# Patient Record
Sex: Male | Born: 1945 | Race: White | Hispanic: No | Marital: Married | Smoking: Never smoker
Health system: Southern US, Community
[De-identification: ages and names within clinical notes are randomized; demographics above are authoritative.]

## PROBLEM LIST (undated history)

## (undated) DIAGNOSIS — E7800 Pure hypercholesterolemia, unspecified: Secondary | ICD-10-CM

## (undated) DIAGNOSIS — E119 Type 2 diabetes mellitus without complications: Secondary | ICD-10-CM

---

## 2012-03-27 ENCOUNTER — Ambulatory Visit: Payer: Self-pay | Admitting: Family Medicine

## 2012-04-12 ENCOUNTER — Ambulatory Visit: Payer: Self-pay | Admitting: Emergency Medicine

## 2012-04-12 LAB — RAPID STREP-A WITH REFLX: Micro Text Report: NEGATIVE

## 2012-04-14 LAB — BETA STREP CULTURE(ARMC)

## 2014-04-08 ENCOUNTER — Ambulatory Visit: Payer: Self-pay | Admitting: Physician Assistant

## 2019-10-24 ENCOUNTER — Other Ambulatory Visit: Payer: Self-pay

## 2019-10-24 DIAGNOSIS — Z20822 Contact with and (suspected) exposure to covid-19: Secondary | ICD-10-CM

## 2019-10-26 LAB — NOVEL CORONAVIRUS, NAA: SARS-CoV-2, NAA: NOT DETECTED

## 2021-04-04 ENCOUNTER — Ambulatory Visit
Admission: EM | Admit: 2021-04-04 | Discharge: 2021-04-04 | Disposition: A | Discharge status: Home / Self Care | Payer: No Typology Code available for payment source | Attending: Family Medicine | Admitting: Family Medicine

## 2021-04-04 ENCOUNTER — Encounter: Payer: Self-pay | Admitting: Emergency Medicine

## 2021-04-04 ENCOUNTER — Ambulatory Visit (INDEPENDENT_AMBULATORY_CARE_PROVIDER_SITE_OTHER): Payer: No Typology Code available for payment source

## 2021-04-04 DIAGNOSIS — R0981 Nasal congestion: Secondary | ICD-10-CM | POA: Diagnosis not present

## 2021-04-04 DIAGNOSIS — R059 Cough, unspecified: Secondary | ICD-10-CM | POA: Diagnosis not present

## 2021-04-04 DIAGNOSIS — Z20822 Contact with and (suspected) exposure to covid-19: Secondary | ICD-10-CM | POA: Insufficient documentation

## 2021-04-04 DIAGNOSIS — R531 Weakness: Secondary | ICD-10-CM | POA: Insufficient documentation

## 2021-04-04 DIAGNOSIS — R52 Pain, unspecified: Secondary | ICD-10-CM | POA: Diagnosis not present

## 2021-04-04 DIAGNOSIS — J111 Influenza due to unidentified influenza virus with other respiratory manifestations: Secondary | ICD-10-CM

## 2021-04-04 DIAGNOSIS — R509 Fever, unspecified: Secondary | ICD-10-CM | POA: Diagnosis not present

## 2021-04-04 HISTORY — DX: Type 2 diabetes mellitus without complications: E11.9

## 2021-04-04 HISTORY — DX: Pure hypercholesterolemia, unspecified: E78.00

## 2021-04-04 LAB — RESP PANEL BY RT-PCR (FLU A&B, COVID) ARPGX2
Influenza A by PCR: NEGATIVE
Influenza B by PCR: NEGATIVE
SARS Coronavirus 2 by RT PCR: NEGATIVE

## 2021-04-04 MED ORDER — PROMETHAZINE-DM 6.25-15 MG/5ML PO SYRP: 5.0000 mL | ORAL_SOLUTION | Freq: Four times a day (QID) | ORAL | 0 refills | Status: AC | PRN

## 2021-04-04 NOTE — ED Triage Notes (Signed)
Patient c/o fever, weakness, nasal congestion that started last Thursday. He states he feels like he has the flu.

## 2021-04-04 NOTE — Discharge Instructions (Signed)
Rest. Fluids.  Medication as prescribed.  Tylenol as needed for fever.  Take care  Dr. Adriana Simas

## 2021-04-06 NOTE — ED Provider Notes (Signed)
[facility]    CSN: [id number] Arrival date & time: 04/04/21  1817      History   Chief Complaint Chief Complaint  Patient presents with  . Fever  . Weakness  . Nasal Congestion   HPI  75 year old male presents with the above complaints.  Symptoms started on Thursday.  He reports fever, generalized weakness, congestion.  Overall feels poorly.  Pain 5/10 in severity.  No relieving factors.  He has had recent sick contacts.  Desires testing today.  No other associated symptoms.  No other complaints.  Past Medical History:  Diagnosis Date  . Diabetes mellitus without complication (HCC)   . High cholesterol    Home Medications    Prior to Admission medications   Medication Sig Start Date End Date Taking? Authorizing Provider  aspirin 81 MG EC tablet Take 1 tablet by mouth daily. 09/19/13  Yes [provider]  metFORMIN (GLUCOPHAGE) 500 MG tablet Take by mouth. 05/28/20  Yes [provider]  promethazine-dextromethorphan (PROMETHAZINE-DM) 6.25-15 MG/5ML syrup Take 5 mLs by mouth 4 (four) times daily as needed for cough. 04/04/21  Yes Deo Mehringer G, DO  simvastatin (ZOCOR) 40 MG tablet Take by mouth.   Yes [provider]    Family History No family history on file.  Social History Social History   Tobacco Use  . Smoking status: Never Smoker  . Smokeless tobacco: Never Used  Substance Use Topics  . Alcohol use: Never  . Drug use: Never     Allergies   Patient has no known allergies.   Review of Systems Review of Systems Per HPI  Physical Exam Triage Vital Signs ED Triage Vitals  Enc Vitals Group     BP 04/04/21 1843 (!) 148/91     Pulse Rate 04/04/21 1843 (!) 102     Resp 04/04/21 1843 18     Temp 04/04/21 1843 (!) 101.6 F (38.7 C)     Temp Source 04/04/21 1843 Oral     SpO2 04/04/21 1843 99 %     Weight 04/04/21 1840 200 lb (90.7 kg)     Height 04/04/21 1840 5\' 10"  (1.778 m)     Head Circumference --      Peak  Flow --      Pain Score 04/04/21 1840 5     Pain Loc --      Pain Edu? --      Excl. in GC? --    Updated Vital Signs BP (!) 148/91 (BP Location: Left Arm)   Pulse (!) 102   Temp (!) 101.6 F (38.7 C) (Oral)   Resp 18   Ht 5\' 10"  (1.778 m)   Wt 90.7 kg   SpO2 99%   BMI 28.70 kg/m   Visual Acuity Right Eye Distance:   Left Eye Distance:   Bilateral Distance:    Right Eye Near:   Left Eye Near:    Bilateral Near:     Physical Exam Vitals and nursing note reviewed.  Constitutional:      General: He is not in acute distress.    Appearance: Normal appearance. He is not ill-appearing.  HENT:     Head: Normocephalic and atraumatic.  Eyes:     General:        Right eye: No discharge.        Left eye: No discharge.     Conjunctiva/sclera: Conjunctivae normal.  Cardiovascular:     Rate and Rhythm: Regular rhythm. Tachycardia present.  Pulmonary:     Effort: Pulmonary effort is normal.     Breath sounds: Normal breath sounds. No wheezing, rhonchi or rales.  Neurological:     Mental Status: He is alert.  Psychiatric:        Mood and Affect: Mood normal.        Behavior: Behavior normal.    UC Treatments / Results  Labs (all labs ordered are listed, but only abnormal results are displayed) Labs Reviewed  RESP PANEL BY RT-PCR (FLU A&B, COVID) [id number]    EKG   Radiology DG Chest 2 View  Result Date: 04/04/2021 CLINICAL DATA:  Cough and fever for several days EXAM: CHEST - 2 VIEW COMPARISON:  None. FINDINGS: Cardiac shadow is within normal limits. The lungs are clear bilaterally. No effusion is seen. No bony abnormality is noted. IMPRESSION: No active cardiopulmonary disease. Electronically Signed   By: Alcide Clever M.D.   On: 04/04/2021 20:15    Procedures Procedures (including critical care time)  Medications Ordered in UC Medications - No data to display  Initial Impression / Assessment and Plan / UC Course  I have reviewed the triage vital signs and the  nursing notes.  Pertinent labs & imaging results that were available during my care of the patient were reviewed by me and considered in my medical decision making (see chart for details).    75 year old male presents with influenza-like illness.  Patient currently febrile and tachycardic.  Flu and COVID testing negative.  Chest x-ray was obtained and was independently reviewed by me.  Normal chest x-ray.  I suspect that this is a viral illness of some sort.  Promethazine DM for cough.  Advised Tylenol for fever.  Supportive care.  Final Clinical Impressions(s) / UC Diagnoses   Final diagnoses:  Influenza-like illness     Discharge Instructions     Rest. Fluids.  Medication as prescribed.  Tylenol as needed for fever.  Take care  Dr. Adriana Simas    ED Prescriptions    Medication Sig Dispense Auth. Provider   promethazine-dextromethorphan (PROMETHAZINE-DM) 6.25-15 MG/5ML syrup Take 5 mLs by mouth 4 (four) times daily as needed for cough. 118 mL Tommie Sams, DO     PDMP not reviewed this encounter.   Tommie Sams, [place] 04/06/21 [phone]

## 2022-04-25 IMAGING — CR DG CHEST 2V
4 series · 4 of 4 positions shown · non-contrast
Comparison: None.

CLINICAL DATA: Cough and fever for several days

EXAM:
CHEST - 2 VIEW

[chest pa (1 of 2)]
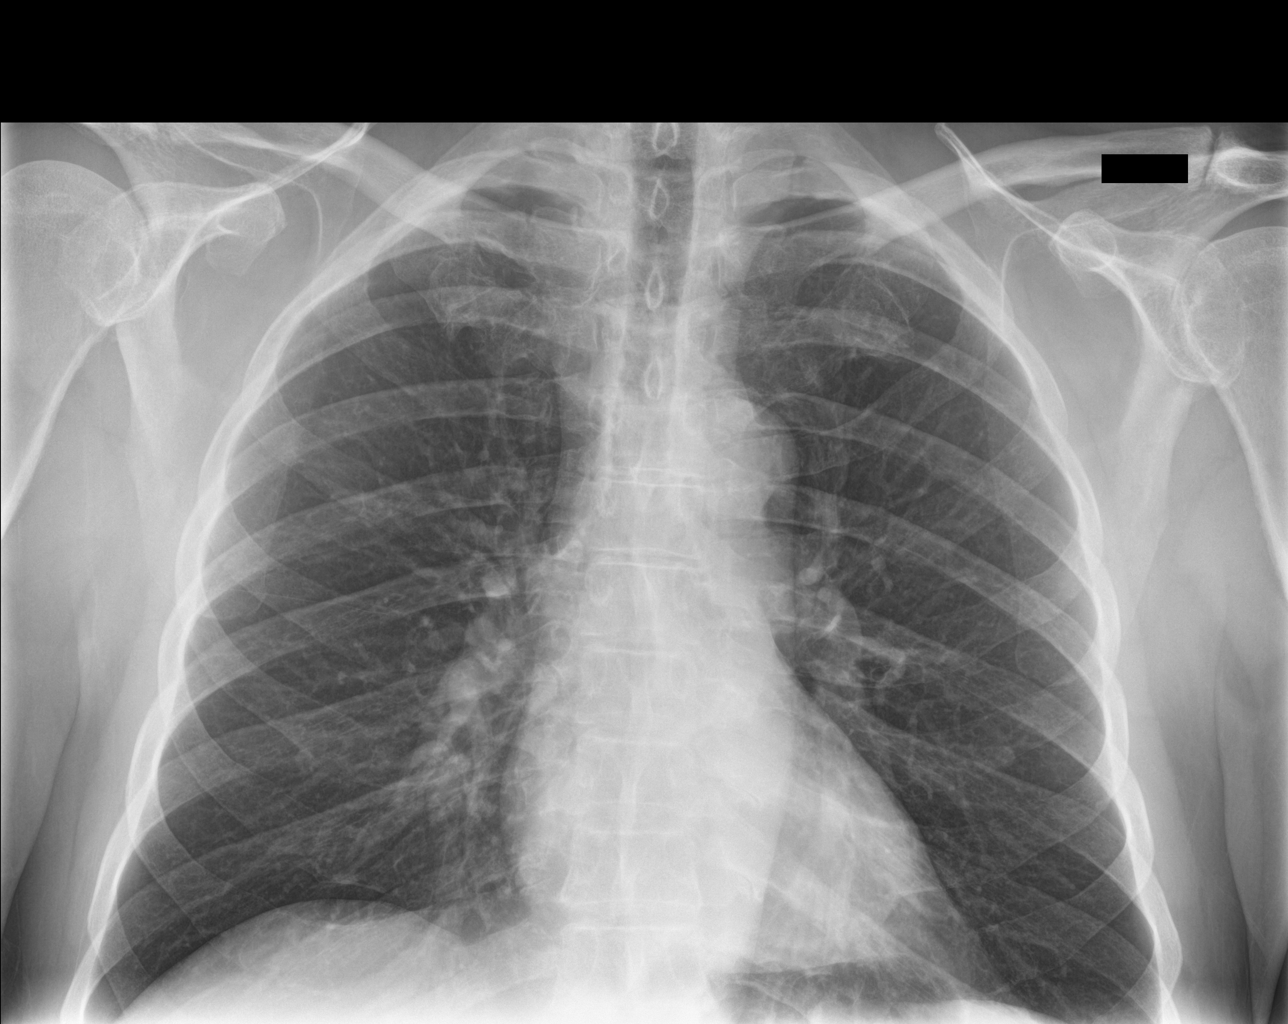

[chest lat (1 of 2)]
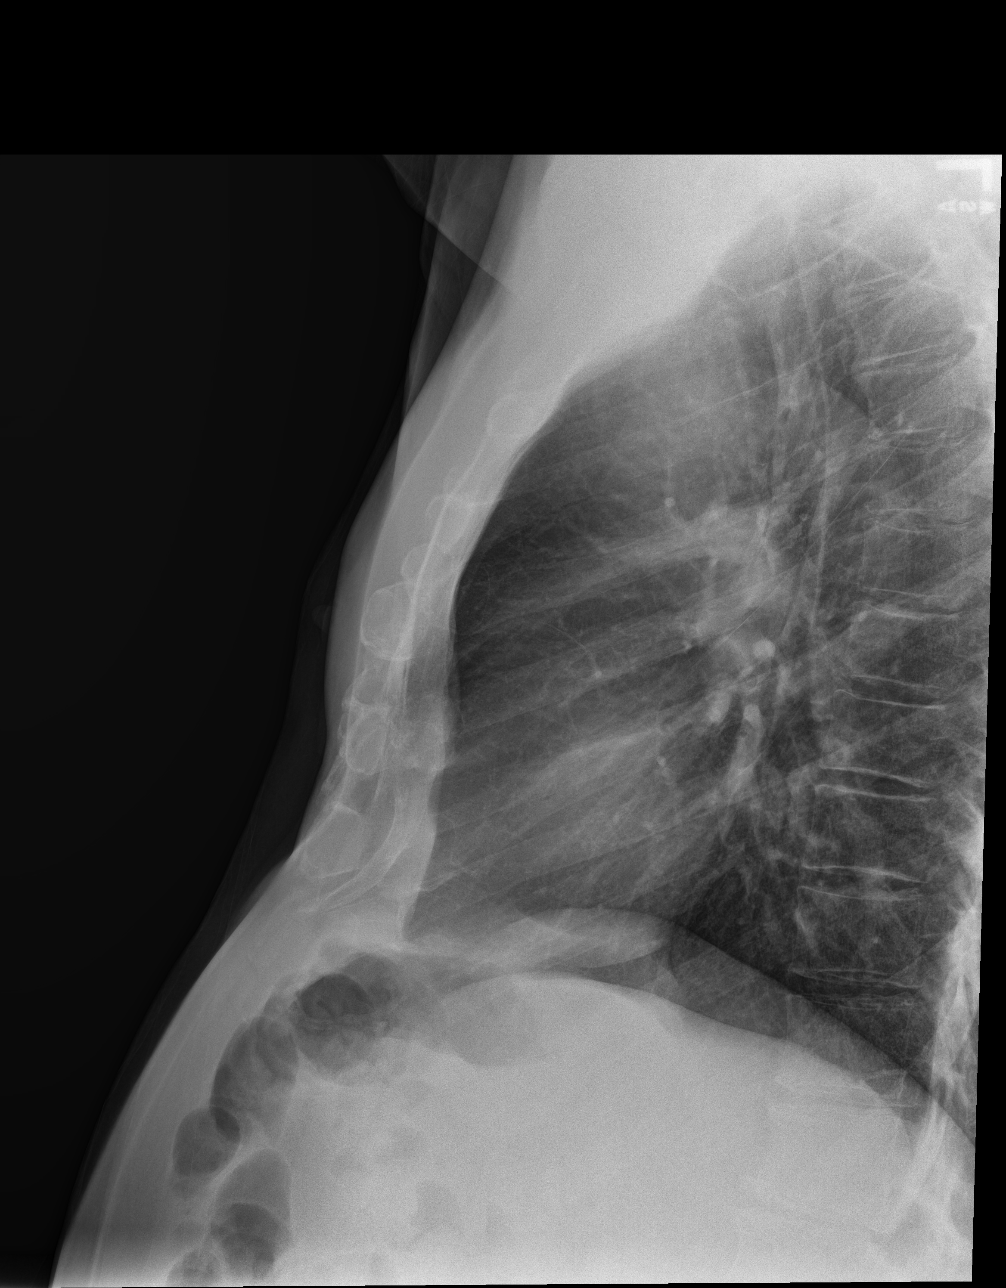

[chest pa (2 of 2)]
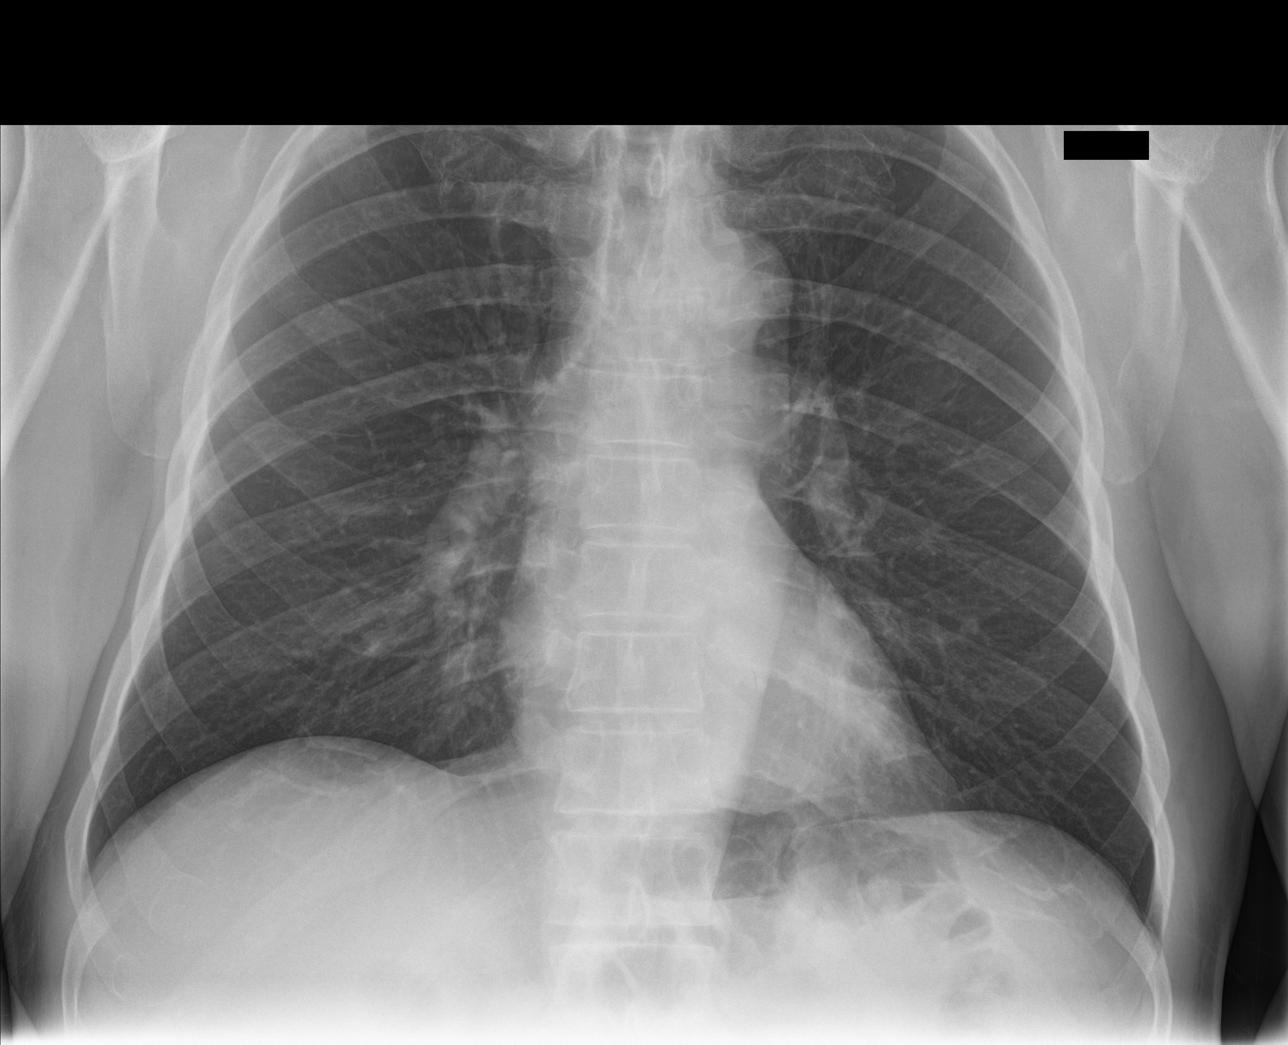

[chest lat (2 of 2)]
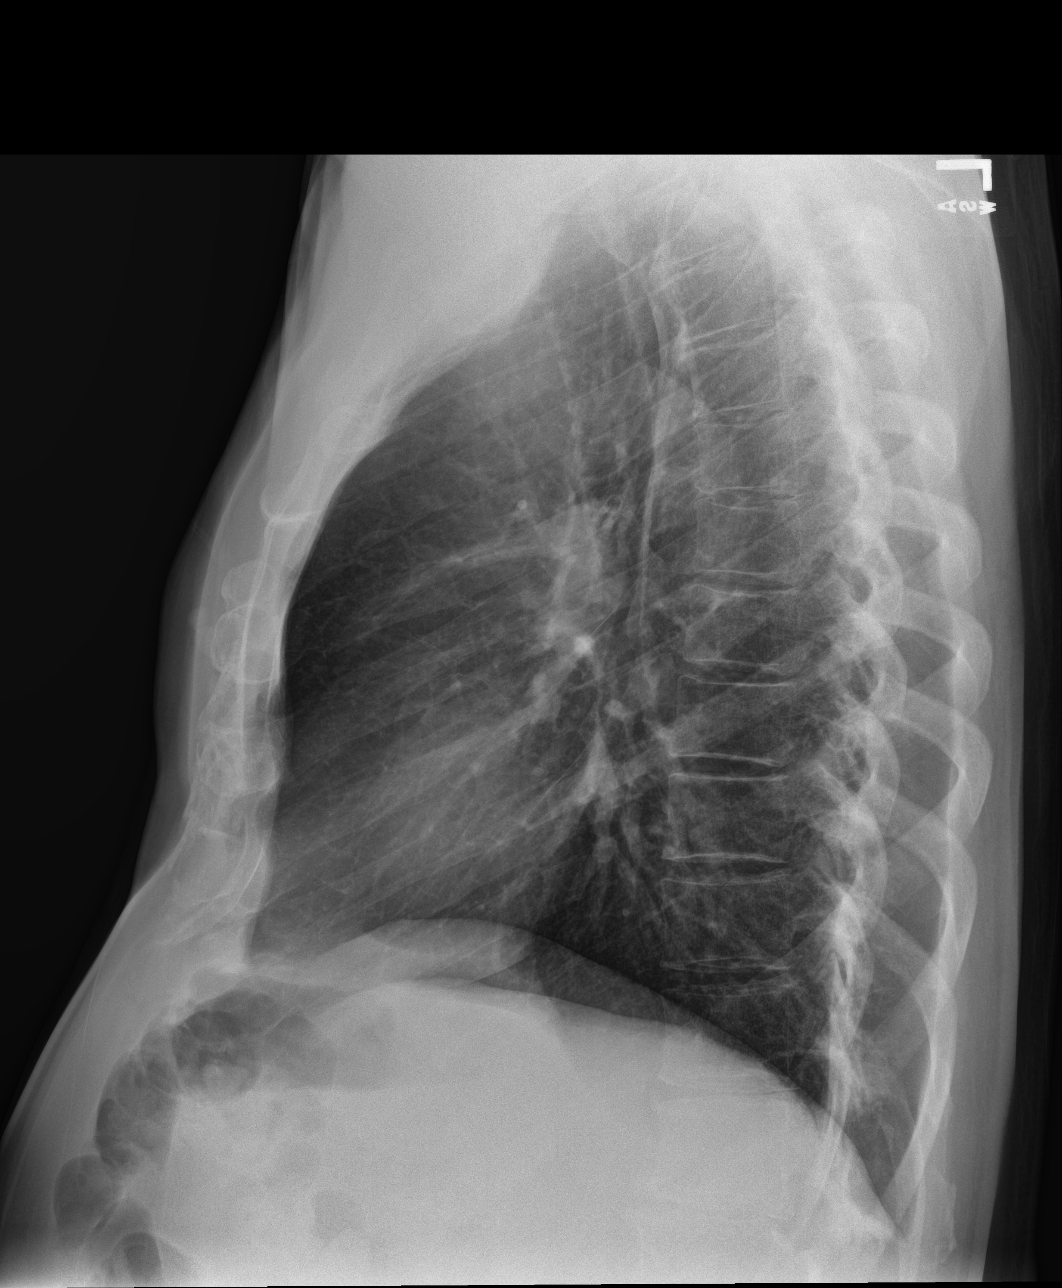

[4 of 4 positions shown; findings below may reference images not displayed]

FINDINGS: Cardiac shadow is within normal limits. The lungs are clear
bilaterally. No effusion is seen. No bony abnormality is noted.
IMPRESSION: No active cardiopulmonary disease.
